# Patient Record
Sex: Male | Born: 1955 | Race: White | Hispanic: No | Marital: Married | Smoking: Former smoker
Health system: Southern US, Community
[De-identification: ages and names within clinical notes are randomized; demographics above are authoritative.]

## PROBLEM LIST (undated history)

## (undated) DIAGNOSIS — N400 Enlarged prostate without lower urinary tract symptoms: Secondary | ICD-10-CM

## (undated) DIAGNOSIS — E119 Type 2 diabetes mellitus without complications: Secondary | ICD-10-CM

## (undated) DIAGNOSIS — G4733 Obstructive sleep apnea (adult) (pediatric): Secondary | ICD-10-CM

## (undated) DIAGNOSIS — E785 Hyperlipidemia, unspecified: Secondary | ICD-10-CM

## (undated) HISTORY — PX: HERNIA REPAIR: SHX51

## (undated) HISTORY — PX: OTHER SURGICAL HISTORY: SHX169

---

## 2004-12-18 ENCOUNTER — Encounter: Admission: RE | Admit: 2004-12-18 | Discharge: 2004-12-18 | Payer: Self-pay | Admitting: General Surgery

## 2004-12-25 ENCOUNTER — Ambulatory Visit (HOSPITAL_BASED_OUTPATIENT_CLINIC_OR_DEPARTMENT_OTHER): Admission: RE | Admit: 2004-12-25 | Discharge: 2004-12-25 | Payer: Self-pay | Admitting: General Surgery

## 2004-12-25 ENCOUNTER — Ambulatory Visit (HOSPITAL_COMMUNITY): Admission: RE | Admit: 2004-12-25 | Discharge: 2004-12-25 | Payer: Self-pay | Admitting: General Surgery

## 2021-02-21 ENCOUNTER — Other Ambulatory Visit: Payer: Self-pay

## 2021-02-21 ENCOUNTER — Emergency Department (HOSPITAL_COMMUNITY): Payer: Commercial Managed Care - HMO

## 2021-02-21 ENCOUNTER — Encounter (HOSPITAL_COMMUNITY): Payer: Self-pay | Admitting: Student

## 2021-02-21 ENCOUNTER — Emergency Department (HOSPITAL_COMMUNITY)
Admission: EM | Admit: 2021-02-21 | Discharge: 2021-02-21 | Disposition: A | Discharge status: Home / Self Care | Payer: Commercial Managed Care - HMO | Attending: Emergency Medicine | Admitting: Emergency Medicine

## 2021-02-21 DIAGNOSIS — R002 Palpitations: Secondary | ICD-10-CM | POA: Diagnosis present

## 2021-02-21 DIAGNOSIS — Z7901 Long term (current) use of anticoagulants: Secondary | ICD-10-CM | POA: Diagnosis not present

## 2021-02-21 DIAGNOSIS — Z20822 Contact with and (suspected) exposure to covid-19: Secondary | ICD-10-CM | POA: Diagnosis not present

## 2021-02-21 DIAGNOSIS — I4891 Unspecified atrial fibrillation: Secondary | ICD-10-CM | POA: Diagnosis not present

## 2021-02-21 DIAGNOSIS — E119 Type 2 diabetes mellitus without complications: Secondary | ICD-10-CM | POA: Insufficient documentation

## 2021-02-21 DIAGNOSIS — F17290 Nicotine dependence, other tobacco product, uncomplicated: Secondary | ICD-10-CM | POA: Diagnosis not present

## 2021-02-21 HISTORY — DX: Benign prostatic hyperplasia without lower urinary tract symptoms: N40.0

## 2021-02-21 HISTORY — DX: Obstructive sleep apnea (adult) (pediatric): G47.33

## 2021-02-21 HISTORY — DX: Hyperlipidemia, unspecified: E78.5

## 2021-02-21 HISTORY — DX: Type 2 diabetes mellitus without complications: E11.9

## 2021-02-21 LAB — CBC WITH DIFFERENTIAL/PLATELET
Abs Immature Granulocytes: 0.01 10*3/uL (ref 0.00–0.07)
Basophils Absolute: 0 10*3/uL (ref 0.0–0.1)
Basophils Relative: 0 %
Eosinophils Absolute: 0.1 10*3/uL (ref 0.0–0.5)
Eosinophils Relative: 1 %
HCT: 38.5 % — ABNORMAL LOW (ref 39.0–52.0)
Hemoglobin: 12.8 g/dL — ABNORMAL LOW (ref 13.0–17.0)
Immature Granulocytes: 0 %
Lymphocytes Relative: 20 %
Lymphs Abs: 1.3 10*3/uL (ref 0.7–4.0)
MCH: 27.6 pg (ref 26.0–34.0)
MCHC: 33.2 g/dL (ref 30.0–36.0)
MCV: 83 fL (ref 80.0–100.0)
Monocytes Absolute: 0.6 10*3/uL (ref 0.1–1.0)
Monocytes Relative: 10 %
Neutro Abs: 4.2 10*3/uL (ref 1.7–7.7)
Neutrophils Relative %: 69 %
Platelets: 298 10*3/uL (ref 150–400)
RBC: 4.64 MIL/uL (ref 4.22–5.81)
RDW: 15.3 % (ref 11.5–15.5)
WBC: 6.2 10*3/uL (ref 4.0–10.5)
nRBC: 0 % (ref 0.0–0.2)

## 2021-02-21 LAB — RESP PANEL BY RT-PCR (FLU A&B, COVID) ARPGX2
Influenza A by PCR: NEGATIVE
Influenza B by PCR: NEGATIVE
SARS Coronavirus 2 by RT PCR: NEGATIVE

## 2021-02-21 LAB — COMPREHENSIVE METABOLIC PANEL
ALT: 37 U/L (ref 0–44)
AST: 25 U/L (ref 15–41)
Albumin: 3.8 g/dL (ref 3.5–5.0)
Alkaline Phosphatase: 49 U/L (ref 38–126)
Anion gap: 9 (ref 5–15)
BUN: 19 mg/dL (ref 8–23)
CO2: 24 mmol/L (ref 22–32)
Calcium: 9 mg/dL (ref 8.9–10.3)
Chloride: 102 mmol/L (ref 98–111)
Creatinine, Ser: 0.92 mg/dL (ref 0.61–1.24)
GFR, Estimated: >60 mL/min (ref >60)
Glucose, Bld: 226 mg/dL — ABNORMAL HIGH (ref 70–99)
Potassium: 4.1 mmol/L (ref 3.5–5.1)
Sodium: 135 mmol/L (ref 135–145)
Total Bilirubin: 0.8 mg/dL (ref 0.3–1.2)
Total Protein: 6.5 g/dL (ref 6.5–8.1)

## 2021-02-21 LAB — TSH: TSH: 1.486 u[IU]/mL (ref 0.350–4.500)

## 2021-02-21 LAB — TROPONIN I (HIGH SENSITIVITY)
Troponin I (High Sensitivity): 7 ng/L (ref <18)
Troponin I (High Sensitivity): 13 ng/L (ref <18)

## 2021-02-21 LAB — T4, FREE: Free T4: 1.24 ng/dL — ABNORMAL HIGH (ref 0.61–1.12)

## 2021-02-21 MED ORDER — APIXABAN (ELIQUIS) VTE STARTER PACK (10MG AND 5MG): ORAL_TABLET | ORAL | 0 refills | Status: DC

## 2021-02-21 MED ORDER — SODIUM CHLORIDE 0.9 % IV BOLUS
1000.0000 mL | Freq: Once | INTRAVENOUS | Status: AC
  Administered 2021-02-21: 1000 mL via INTRAVENOUS

## 2021-02-21 MED ORDER — APIXABAN 5 MG PO TABS
5.0000 mg | ORAL_TABLET | Freq: Two times a day (BID) | ORAL | Status: DC
  Administered 2021-02-21: 5 mg via ORAL
  Filled 2021-02-21: qty 1

## 2021-02-21 MED ORDER — ETOMIDATE 2 MG/ML IV SOLN
10.0000 mg | Freq: Once | INTRAVENOUS | Status: DC
  Filled 2021-02-21: qty 10

## 2021-02-21 MED ORDER — SODIUM CHLORIDE 0.9 % IV BOLUS
500.0000 mL | Freq: Once | INTRAVENOUS | Status: AC
  Administered 2021-02-21: 500 mL via INTRAVENOUS

## 2021-02-21 MED ORDER — APIXABAN 5 MG PO TABS: 5.0000 mg | ORAL_TABLET | Freq: Two times a day (BID) | ORAL | 0 refills | Status: DC

## 2021-02-21 MED ORDER — DILTIAZEM LOAD VIA INFUSION
20.0000 mg | Freq: Once | INTRAVENOUS | Status: AC
  Administered 2021-02-21: 20 mg via INTRAVENOUS
  Filled 2021-02-21: qty 20

## 2021-02-21 MED ORDER — APIXABAN 2.5 MG PO TABS: ORAL_TABLET | ORAL | 0 refills | Status: DC

## 2021-02-21 MED ORDER — ETOMIDATE 2 MG/ML IV SOLN
INTRAVENOUS | Status: AC | PRN
  Administered 2021-02-21: 9 mg via INTRAVENOUS

## 2021-02-21 MED ORDER — DILTIAZEM HCL-DEXTROSE 125-5 MG/125ML-% IV SOLN (PREMIX)
5.0000 mg/h | INTRAVENOUS | Status: DC
  Administered 2021-02-21: 5 mg/h via INTRAVENOUS
  Filled 2021-02-21: qty 125

## 2021-02-21 NOTE — Discharge Instructions (Addendum)
Blake Oconnor, it was a pleasure taking care of you. You were seen for Atrial Fibrillation with Rapid Ventricular Response. Your rate was controlled with diltiazem, but you remained in atrial fibrillation so you were electrically cardioverted. Your heart then went into normal rhythm. Throughout this time, your vital signs were stable.  Your workup was also reassuring. You had a negative COVID test, normal troponin (heart enzyme), normal electrolytes. Your hemoglobin was 12.8 which is slightly anemic. Your thyroid testing was mildly abnormal with TSH normal at 1.48 and T4 slightly high at 1.24. Follow up with your PCP on this.  We are discharging you with an anticoagulant, Eliquis. Take twice a day. I have placed a referral for the A fib clinic, please follow up with them. If you have recurrent A fib, please return for further care.     Information on my medicine - ELIQUIS (apixaban)  This medication education was reviewed with me or my healthcare representative as part of my discharge preparation.    Why was Eliquis prescribed for you? Eliquis was prescribed for you to reduce the risk of a blood clot forming that can cause a stroke if you have a medical condition called atrial fibrillation (a type of irregular heartbeat).  What do You need to know about Eliquis ? Take your Eliquis TWICE DAILY - one tablet in the morning and one tablet in the evening with or without food. If you have difficulty swallowing the tablet whole please discuss with your pharmacist how to take the medication safely.  Take Eliquis exactly as prescribed by your doctor and DO NOT stop taking Eliquis without talking to the doctor who prescribed the medication.  Stopping may increase your risk of developing a stroke.  Refill your prescription before you run out.  After discharge, you should have regular check-up appointments with your healthcare provider that is prescribing your Eliquis.  In the future your dose may need  to be changed if your kidney function or weight changes by a significant amount or as you get older.  What do you do if you miss a dose? If you miss a dose, take it as soon as you remember on the same day and resume taking twice daily.  Do not take more than one dose of ELIQUIS at the same time to make up a missed dose.  Important Safety Information A possible side effect of Eliquis is bleeding. You should call your healthcare provider right away if you experience any of the following: Bleeding from an injury or your nose that does not stop. Unusual colored urine (red or dark brown) or unusual colored stools (red or black). Unusual bruising for unknown reasons. A serious fall or if you hit your head (even if there is no bleeding).  Some medicines may interact with Eliquis and might increase your risk of bleeding or clotting while on Eliquis. To help avoid this, consult your healthcare provider or pharmacist prior to using any new prescription or non-prescription medications, including herbals, vitamins, non-steroidal anti-inflammatory drugs (NSAIDs) and supplements.  This website has more information on Eliquis (apixaban): http://www.eliquis.com/eliquis/home

## 2021-02-21 NOTE — ED Triage Notes (Signed)
Pt BIB [facility] from home c/o A-fib RVR. Pt stated he was walking his dog when he felt palpitations and weak, apple watch said he was in A-fib 170's. Pt received 10 of cardizem no change, another 10 and converted to A-fib rate of 70-110.

## 2021-02-21 NOTE — ED Provider Notes (Addendum)
Physical Exam  BP 122/78   Pulse (!) 57   Temp 98 F (36.7 C) (Oral)   Resp 12   Ht 1.778 m (5\' 10" )   Wt 79.4 kg   SpO2 100%   BMI 25.11 kg/m   Physical Exam  ED Course/Procedures    I saw and evaluated the patient, reviewed the resident's note and I agree with the findings and plan.  EKG Interpretation  Date/Time:  Friday February 21 2021 09:00:07 EDT Ventricular Rate:  115 PR Interval:    QRS Duration: 99 QT Interval:  310 QTC Calculation: 429 R Axis:   68 Text Interpretation: Atrial fibrillation Borderline repolarization abnormality Confirmed by 12[birth year]14[birth year]1983 (Lorre Nick) on 02/21/2021 9:07:29 AM   .Cardioversion  Date/Time: 02/21/2021 1:39 PM Performed by: 02/23/2021, MD Authorized by: Lorre Nick, MD   Consent:    Consent obtained:  Written   Consent given by:  Patient   Risks discussed:  Death   Alternatives discussed:  No treatment Pre-procedure details:    Cardioversion basis:  Emergent   Rhythm:  Atrial fibrillation   Electrode placement:  Anterior-posterior Patient sedated: Yes. Refer to sedation procedure documentation for details of sedation.  Attempt one:    Cardioversion mode:  Synchronous   Waveform:  Monophasic   Shock (Joules):  200   Shock outcome:  Conversion to normal sinus rhythm Post-procedure details:    Patient status:  Awake   Patient tolerance of procedure:  Tolerated well, no immediate complications .Sedation  Date/Time: 02/21/2021 1:40 PM Performed by: 02/23/2021, MD Authorized by: Lorre Nick, MD   Consent:    Consent obtained:  Written   Consent given by:  Patient   Risks discussed:  Dysrhythmia   Alternatives discussed:  Analgesia without sedation Universal protocol:    Immediately prior to procedure, a time out was called: no     Patient identity confirmed:  Arm band Indications:    Procedure performed:  Cardioversion Pre-sedation assessment:    Time since last food or drink:  7pm   ASA classification:  class 1 - normal, healthy patient     Mouth opening:  3 or more finger widths   Mallampati score:  I - soft palate, uvula, fauces, pillars visible   Neck mobility: normal     Pre-sedation assessments completed and reviewed: airway patency     Pre-sedation assessment completed:  02/21/2021 1:21 PM Immediate pre-procedure details:    Reassessment: Patient reassessed immediately prior to procedure     Reviewed: vital signs     Verified: bag valve mask available, emergency equipment available, intubation equipment available, IV patency confirmed, oxygen available and suction available   Procedure details (see MAR for exact dosages):    Preoxygenation:  Room air   Sedation:  Etomidate   Intended level of sedation: deep   Intra-procedure monitoring:  Blood pressure monitoring, continuous capnometry, continuous pulse oximetry and cardiac monitor   Intra-procedure events: none     Total Provider sedation time (minutes):  15 Post-procedure details:    Post-sedation assessment completed:  02/21/2021 1:42 PM   Attendance: Constant attendance by certified staff until patient recovered     Recovery: Patient returned to pre-procedure baseline     Post-sedation assessments completed and reviewed: airway patency, cardiovascular function and hydration status     Patient is stable for discharge or admission: yes     Procedure completion:  Tolerated well, no immediate complications    Patient is in sinus rhythm at this time.  Will start anticoagulant and give referral to A. fib clinic.  CRITICAL CARE Performed by: Toy Baker Total critical care time: 55 minutes Critical care time was exclusive of separately billable procedures and treating other patients. Critical care was necessary to treat or prevent imminent or life-threatening deterioration. Critical care was time spent personally by me on the following activities: development of treatment plan with patient and/or surrogate as well as nursing,  discussions with consultants, evaluation of patient's response to treatment, examination of patient, obtaining history from patient or surrogate, ordering and performing treatments and interventions, ordering and review of laboratory studies, ordering and review of radiographic studies, pulse oximetry and re-evaluation of patient's condition.    Lorre Nick, MD 02/21/21 1344    Lorre Nick, MD 03/06/21 [phone]

## 2021-02-21 NOTE — ED Provider Notes (Signed)
[facility] EMERGENCY DEPARTMENT Provider Note   CSN: [id number] Arrival date & time: 02/21/21  6295     History Chief Complaint  Patient presents with  . A-fib RVR    Blake Oconnor is a 65 y.o. male DM, OSA, HLD who presents with palpitations, found to be in A fib with RVR by EMS. States that after waking this morning, he started having palpitations. His Apple Watch read his heart rate at 170 and in A fib. He has had occasionally palpitations before which have resolved spontaneously, so he went about his normal routine. 45 minutes later he was walking his dog when he felt dizzy and weak, so he called EMS. EMS administered 20mg  of cardizem with improvement in heart rate to 110s-130s. He denies chest pain or shortness of breath. Has been in his usual state of health until this morning. He did have a plane ride last week but it was only 1 hour 45 minutes. No leg swelling. He drinks a glass of wine roughly 3 nights a week. Has had Covid vaccine x2 and a booster. No history of known heart disease or lung disease.   Past Medical History:  Diagnosis Date  . BPH (benign prostatic hyperplasia)   . Diabetes mellitus without complication (HCC)   . HLD (hyperlipidemia)   . OSA (obstructive sleep apnea)     Past Surgical History:  Procedure Laterality Date  . HERNIA REPAIR    . hydrocelectomy    . vericocelectomy         Family History  Problem Relation Age of Onset  . Diabetes Father   . Diabetes Brother   . Diabetes Brother   . Breast cancer Mother   . Cervical cancer Mother   . Stroke Paternal Uncle   . Heart attack Neg Hx   . Lung cancer Neg Hx     Social History   Tobacco Use  . Smoking status: Current Every Day Smoker  . Smokeless tobacco: Never Used  . Tobacco comment: used to smoke cigars  Substance Use Topics  . Alcohol use: Yes    Alcohol/week: 3.0 standard drinks    Types: 3 Standard drinks or equivalent per week    Comment: 1 glass of wine at  times  . Drug use: Never    Home Medications Prior to Admission medications   Medication Sig Start Date End Date Taking? Authorizing Provider  apixaban (ELIQUIS) 5 MG TABS tablet Take 1 tablet (5 mg total) by mouth 2 (two) times daily. 02/21/21 03/23/21  03/25/21, MD    Allergies    Patient has no known allergies.  Review of Systems   Review of Systems  Constitutional: Negative for chills, fever and unexpected weight change.  HENT: Negative for congestion and sore throat.   Eyes: Negative for visual disturbance.  Respiratory: Negative for cough and shortness of breath.   Cardiovascular: Positive for palpitations. Negative for chest pain and leg swelling.  Gastrointestinal: Negative for abdominal pain, nausea and vomiting.  Musculoskeletal: Negative for arthralgias and myalgias.  Neurological: Positive for dizziness and weakness.  Psychiatric/Behavioral: Negative for agitation and confusion.  All other systems reviewed and are negative.   Physical Exam Updated Vital Signs BP 113/74   Pulse 63   Temp 98.1 F (36.7 C) (Oral)   Resp 19   Ht 5\' 10"  (1.778 m)   Wt 79.4 kg   SpO2 99%   BMI 25.11 kg/m   Physical Exam Vitals and nursing note  reviewed.  Constitutional:      General: He is not in acute distress.    Appearance: Normal appearance. He is not ill-appearing or toxic-appearing.  HENT:     Head: Normocephalic and atraumatic.     Right Ear: External ear normal.     Left Ear: External ear normal.     Nose: Nose normal. No congestion.     Mouth/Throat:     Mouth: Mucous membranes are moist.     Pharynx: Oropharynx is clear.  Eyes:     Extraocular Movements: Extraocular movements intact.  Cardiovascular:     Rate and Rhythm: Tachycardia present. Rhythm irregular.     Heart sounds: No murmur heard. No friction rub. No gallop.   Pulmonary:     Effort: Pulmonary effort is normal. No respiratory distress.     Breath sounds: Normal breath sounds. No wheezing,  rhonchi or rales.  Abdominal:     General: Abdomen is flat.     Palpations: Abdomen is soft.     Tenderness: There is no abdominal tenderness. There is no guarding.  Musculoskeletal:        General: No deformity or signs of injury. Normal range of motion.     Cervical back: Normal range of motion and neck supple.     Right lower leg: No edema.     Left lower leg: No edema.  Skin:    General: Skin is warm and dry.     Capillary Refill: Capillary refill takes less than 2 seconds.  Neurological:     General: No focal deficit present.     Mental Status: He is alert and oriented to person, place, and time.  Psychiatric:        Mood and Affect: Mood normal.        Behavior: Behavior normal.     ED Results / Procedures / Treatments   Labs (all labs ordered are listed, but only abnormal results are displayed) Labs Reviewed  COMPREHENSIVE METABOLIC PANEL - Abnormal; Notable for the following components:      Result Value   Glucose, Bld 226 (*)    All other components within normal limits  CBC WITH DIFFERENTIAL/PLATELET - Abnormal; Notable for the following components:   Hemoglobin 12.8 (*)    HCT 38.5 (*)    All other components within normal limits  T4, FREE - Abnormal; Notable for the following components:   Free T4 1.24 (*)    All other components within normal limits  RESP PANEL BY RT-PCR (FLU A&B, COVID) ARPGX2  TSH  TROPONIN I (HIGH SENSITIVITY)  TROPONIN I (HIGH SENSITIVITY)    EKG EKG Interpretation  Date/Time:  Friday February 21 2021 13:31:55 EDT Ventricular Rate:  70 PR Interval:    QRS Duration: 86 QT Interval:  372 QTC Calculation: 402 R Axis:   58 Text Interpretation: Sinus rhythm afib has resolved Confirmed by Lorre Nick ([id number]) on 02/21/2021 2:51:02 PM   Radiology DG Chest Portable 1 View  Result Date: 02/21/2021 CLINICAL DATA:  Atrial fibrillation. EXAM: PORTABLE CHEST 1 VIEW COMPARISON:  December 18, 2004. FINDINGS: The heart size and mediastinal  contours are within normal limits. Both lungs are clear. No pneumothorax or pleural effusion is noted. The visualized skeletal structures are unremarkable. IMPRESSION: No active disease. Electronically Signed   By: Lupita Raider M.D.   On: 02/21/2021 09:29    Procedures Procedures   Medications Ordered in ED Medications  diltiazem (CARDIZEM) 1 mg/mL load via infusion 20 mg (  20 mg Intravenous Bolus from Bag 02/21/21 1058)    And  diltiazem (CARDIZEM) 125 mg in dextrose 5% 125 mL (1 mg/mL) infusion (0 mg/hr Intravenous Stopped 02/21/21 1338)  etomidate (AMIDATE) injection 10 mg (has no administration in time range)  apixaban (ELIQUIS) tablet 5 mg (5 mg Oral Given 02/21/21 1449)  sodium chloride 0.9 % bolus 500 mL (0 mLs Intravenous Stopped 02/21/21 1131)  sodium chloride 0.9 % bolus 1,000 mL (0 mLs Intravenous Stopped 02/21/21 1445)  etomidate (AMIDATE) injection (9 mg Intravenous Given 02/21/21 1329)    ED Course  I have reviewed the triage vital signs and the nursing notes.  Pertinent labs & imaging results that were available during my care of the patient were reviewed by me and considered in my medical decision making (see chart for details).    MDM Rules/Calculators/A&P     CHA2DS2-VASc Score: 1                     Patient presents with first known episode of A fib which started this morning. Hemodynamically stable. Received 20mg  cardizem prior to arrival with improvement. No predisposing history including heart disease, lung disease, mild alcohol use, thyroid disease. Workup pending, start cardizem bolus and drip.   Update: since starting diltiazem drip, rate is now controlled in 70s but remains in A fib. Palpitations have resolved. Giving 500cc bolus for soft BP in 100s/70s.  Update: remains in rate controlled A fib. Proceeded with cardioversion as he remained in A fib despite diltiazem treatment. He tolerated the procedure well and converted into sinus rhythm. Vitals remain stable  throughout. CHADSVASC score of 1, Started on Eliquis for anticoagulation. Discharged with referral to A fib clinic placed. Normal TSH with T4 mildly elevated, patient will follow up with PCP for this.  Final Clinical Impression(s) / ED Diagnoses Final diagnoses:  Atrial fibrillation with RVR (HCC)    Rx / DC Orders ED Discharge Orders         Ordered    Amb referral to AFIB Clinic        02/21/21 1336    APIXABAN (ELIQUIS) VTE STARTER PACK (10MG  AND 5MG )  Status:  Discontinued        02/21/21 1402    apixaban (ELIQUIS) 2.5 MG TABS tablet  Status:  Discontinued        02/21/21 1414    apixaban (ELIQUIS) 5 MG TABS tablet  2 times daily        02/21/21 1424           02/23/21, MD 02/21/21 1506    02/23/21, MD 02/24/21 [phone]

## 2021-03-03 NOTE — Progress Notes (Signed)
Primary Care Physician: Darrow Bussing, MD Primary Cardiologist: none Primary Electrophysiologist: none Referring Physician: [facility]   Blake Oconnor is a 65 y.o. male with a history of DM, OSA, HLD, and atrial fibrillation who presents for consultation in the [facility]. The patient was initially diagnosed with atrial fibrillation 02/21/21 after presenting to the ED with symptoms of palpitations. His Apple Watch read afib with RVR. He started having associated symptoms of dizziness and weakness and so EMS was called. ECG at the ED showed rate controlled afib. He underwent DCCV at that time and was started on Eliquis a CHADS2VASC score of 1. There were no specific triggers that he could identify. He is compliant with his CPAP and drinks 2-3 glasses of wine per week. He has not had any further symptoms.   Today, he denies symptoms of palpitations, chest pain, shortness of breath, orthopnea, PND, lower extremity edema, dizziness, presyncope, syncope, bleeding, or neurologic sequela. The patient is tolerating medications without difficulties and is otherwise without complaint today.    Atrial Fibrillation Risk Factors:  he does have symptoms or diagnosis of sleep apnea. he is compliant with CPAP therapy. he does not have a history of rheumatic fever. he does have a history of alcohol use. The patient does not have a history of early familial atrial fibrillation or other arrhythmias.  he has a BMI of Body mass index is 25.31 kg/m.Marland Kitchen Filed Weights   03/04/21 0858  Weight: 80 kg    Family History  Problem Relation Age of Onset  . Diabetes Father   . Diabetes Brother   . Diabetes Brother   . Breast cancer Mother   . Cervical cancer Mother   . Stroke Paternal Uncle   . Heart attack Neg Hx   . Lung cancer Neg Hx      Atrial Fibrillation Management history:  Previous antiarrhythmic drugs: none Previous cardioversions: 02/21/21 Previous ablations:  none CHADS2VASC score: 1 Anticoagulation history: Eliquis   Past Medical History:  Diagnosis Date  . BPH (benign prostatic hyperplasia)   . Diabetes mellitus without complication (HCC)   . HLD (hyperlipidemia)   . OSA (obstructive sleep apnea)    Past Surgical History:  Procedure Laterality Date  . HERNIA REPAIR    . hydrocelectomy    . vericocelectomy      Current Outpatient Medications  Medication Sig Dispense Refill  . apixaban (ELIQUIS) 5 MG TABS tablet Take 1 tablet (5 mg total) by mouth 2 (two) times daily. 60 tablet 0  . cetirizine (ZYRTEC) 10 MG tablet     . glipiZIDE (GLUCOTROL XL) 2.5 MG 24 hr tablet Take 2.5 mg by mouth daily.    Marland Kitchen glucose blood (ONETOUCH VERIO) test strip See admin instructions.    . metFORMIN (GLUCOPHAGE) 1000 MG tablet Take 1 tablet by mouth 2 (two) times daily.    . naproxen (NAPROSYN) 500 MG tablet     . OneTouch Delica Lancets 33G MISC See admin instructions.    . Pediatric Multiple Vit-C-FA (FLINSTONES GUMMIES OMEGA-3 DHA) CHEW Chew by mouth.    . simvastatin (ZOCOR) 20 MG tablet Take 20 mg by mouth daily.    . tamsulosin (FLOMAX) 0.4 MG CAPS capsule Take 0.4 mg by mouth daily.     No current facility-administered medications for this encounter.    No Known Allergies  Social History   Socioeconomic History  . Marital status: Unknown    Spouse name: Not on file  .  Number of children: Not on file  . Years of education: Not on file  . Highest education level: Not on file  Occupational History  . Not on file  Tobacco Use  . Smoking status: Former Games developer  . Smokeless tobacco: Never Used  . Tobacco comment: used to smoke cigars  Substance and Sexual Activity  . Alcohol use: Not Currently    Alcohol/week: 3.0 standard drinks    Types: 3 Glasses of wine per week    Comment: 1 glass of wine at times  . Drug use: Never  . Sexual activity: Not on file  Other Topics Concern  . Not on file  Social History Narrative  . Not on file    Social Determinants of Health   Financial Resource Strain: Not on file  Food Insecurity: Not on file  Transportation Needs: Not on file  Physical Activity: Not on file  Stress: Not on file  Social Connections: Not on file  Intimate Partner Violence: Not on file     ROS- All systems are reviewed and negative except as per the HPI above.  Physical Exam: Vitals:   03/04/21 0858  BP: 112/72  Pulse: 69  Weight: 80 kg  Height: 5\' 10"  (1.778 m)    GEN- The patient is a well appearing male, alert and oriented x 3 today.   Head- normocephalic, atraumatic Eyes-  Sclera clear, conjunctiva pink Ears- hearing intact Oropharynx- clear Neck- supple  Lungs- Clear to ausculation bilaterally, normal work of breathing Heart- Regular rate and rhythm, no murmurs, rubs or gallops  GI- soft, NT, ND, + BS Extremities- no clubbing, cyanosis, or edema MS- no significant deformity or atrophy Skin- no rash or lesion Psych- euthymic mood, full affect Neuro- strength and sensation are intact  Wt Readings from Last 3 Encounters:  03/04/21 80 kg  02/21/21 79.4 kg    EKG today demonstrates  SR Vent. rate 69 BPM PR interval 126 ms QRS duration 102 ms QT/QTcB 370/396 ms  Epic records are reviewed at length today  CHA2DS2-VASc Score = 1  The patient's score is based upon: CHF History: No HTN History: No Diabetes History: Yes Stroke History: No Vascular Disease History: No Age Score: 0 Gender Score: 0      ASSESSMENT AND PLAN: 1. Paroxysmal Atrial Fibrillation (ICD10:  I48.0) The patient's CHA2DS2-VASc score is 1, indicating a 0.6% annual risk of stroke.   S/p DCCV on 02/21/21 General education about afib provided and questions answered. We also discussed his stroke risk and the risks and benefits of anticoagulation. Check echocardiogram Continue Eliquis 5 mg BID for at least 4 weeks post DCCV. Start diltiazem 30 mg PRN q 4 hours for heart racing.  Apple Watch for home  monitoring   2. Obstructive sleep apnea The importance of adequate treatment of sleep apnea was discussed today in order to improve our ability to maintain sinus rhythm long term. Patient reports compliance with his CPAP therapy.    Follow up in the AF clinic in one month.    02/23/21 PA-C Afib Clinic [facility] [address] [place], [place] [phone] 03/04/2021 9:07 AM

## 2021-03-04 ENCOUNTER — Encounter (HOSPITAL_COMMUNITY): Payer: Self-pay | Admitting: Physician Assistant

## 2021-03-04 ENCOUNTER — Other Ambulatory Visit: Payer: Self-pay

## 2021-03-04 ENCOUNTER — Ambulatory Visit (HOSPITAL_COMMUNITY)
Admission: RE | Admit: 2021-03-04 | Discharge: 2021-03-04 | Disposition: A | Discharge status: Home / Self Care | Payer: Commercial Managed Care - HMO | Source: Ambulatory Visit | Attending: Physician Assistant | Admitting: Physician Assistant

## 2021-03-04 VITALS — BP 112/72 | HR 69 | Ht 70.0 in | Wt 176.4 lb

## 2021-03-04 DIAGNOSIS — E785 Hyperlipidemia, unspecified: Secondary | ICD-10-CM | POA: Insufficient documentation

## 2021-03-04 DIAGNOSIS — E119 Type 2 diabetes mellitus without complications: Secondary | ICD-10-CM | POA: Diagnosis not present

## 2021-03-04 DIAGNOSIS — Z7984 Long term (current) use of oral hypoglycemic drugs: Secondary | ICD-10-CM | POA: Insufficient documentation

## 2021-03-04 DIAGNOSIS — G4733 Obstructive sleep apnea (adult) (pediatric): Secondary | ICD-10-CM | POA: Diagnosis not present

## 2021-03-04 DIAGNOSIS — Z87891 Personal history of nicotine dependence: Secondary | ICD-10-CM | POA: Diagnosis not present

## 2021-03-04 DIAGNOSIS — I480 Paroxysmal atrial fibrillation: Secondary | ICD-10-CM

## 2021-03-04 DIAGNOSIS — Z79899 Other long term (current) drug therapy: Secondary | ICD-10-CM | POA: Diagnosis not present

## 2021-03-04 MED ORDER — DILTIAZEM HCL 30 MG PO TABS: ORAL_TABLET | ORAL | 1 refills | Status: DC

## 2021-03-04 NOTE — Patient Instructions (Signed)
Cardizem 30mg -- take 1 tablet every 4 hours AS NEEDED for heart rate >100 as long as top number of blood pressure >100.  

## 2021-04-07 ENCOUNTER — Ambulatory Visit (HOSPITAL_BASED_OUTPATIENT_CLINIC_OR_DEPARTMENT_OTHER)
Admission: RE | Admit: 2021-04-07 | Discharge: 2021-04-07 | Disposition: A | Discharge status: Home / Self Care | Payer: PRIVATE HEALTH INSURANCE | Source: Ambulatory Visit | Attending: Physician Assistant | Admitting: Physician Assistant

## 2021-04-07 ENCOUNTER — Other Ambulatory Visit: Payer: Self-pay

## 2021-04-07 ENCOUNTER — Encounter (HOSPITAL_COMMUNITY): Payer: Self-pay | Admitting: *Deleted

## 2021-04-07 ENCOUNTER — Encounter (HOSPITAL_COMMUNITY): Payer: Self-pay | Admitting: Physician Assistant

## 2021-04-07 ENCOUNTER — Ambulatory Visit (HOSPITAL_COMMUNITY)
Admission: RE | Admit: 2021-04-07 | Discharge: 2021-04-07 | Disposition: A | Discharge status: Home / Self Care | Payer: PRIVATE HEALTH INSURANCE | Source: Ambulatory Visit | Attending: Family Medicine | Admitting: Family Medicine

## 2021-04-07 VITALS — BP 112/68 | HR 63 | Ht 70.0 in | Wt 178.6 lb

## 2021-04-07 DIAGNOSIS — G4733 Obstructive sleep apnea (adult) (pediatric): Secondary | ICD-10-CM | POA: Insufficient documentation

## 2021-04-07 DIAGNOSIS — E785 Hyperlipidemia, unspecified: Secondary | ICD-10-CM | POA: Diagnosis not present

## 2021-04-07 DIAGNOSIS — Z7984 Long term (current) use of oral hypoglycemic drugs: Secondary | ICD-10-CM | POA: Insufficient documentation

## 2021-04-07 DIAGNOSIS — Z79899 Other long term (current) drug therapy: Secondary | ICD-10-CM | POA: Insufficient documentation

## 2021-04-07 DIAGNOSIS — E119 Type 2 diabetes mellitus without complications: Secondary | ICD-10-CM | POA: Insufficient documentation

## 2021-04-07 DIAGNOSIS — Z720 Tobacco use: Secondary | ICD-10-CM | POA: Diagnosis not present

## 2021-04-07 DIAGNOSIS — I480 Paroxysmal atrial fibrillation: Secondary | ICD-10-CM

## 2021-04-07 DIAGNOSIS — Z9989 Dependence on other enabling machines and devices: Secondary | ICD-10-CM | POA: Insufficient documentation

## 2021-04-07 LAB — ECHOCARDIOGRAM COMPLETE
Area-P 1/2: 1.7 cm2
Calc EF: 49.7 %
S' Lateral: 3.3 cm
Single Plane A2C EF: 50.6 %
Single Plane A4C EF: 50.4 %

## 2021-04-07 NOTE — Progress Notes (Signed)
  Echocardiogram 2D Echocardiogram has been performed.  Augustine Radar 04/07/2021, 10:52 AM

## 2021-04-07 NOTE — Progress Notes (Signed)
Primary Care Physician: Darrow Bussing, MD Primary Cardiologist: none Primary Electrophysiologist: none Referring Physician: [facility]   Blake Oconnor is a 65 y.o. male with a history of DM, OSA, HLD, and atrial fibrillation who presents for follow up in the [facility]. The patient was initially diagnosed with atrial fibrillation 02/21/21 after presenting to the ED with symptoms of palpitations. His Apple Watch read afib with RVR. He started having associated symptoms of dizziness and weakness and so EMS was called. ECG at the ED showed rate controlled afib. He underwent DCCV at that time and was started on Eliquis a CHADS2VASC score of 1. There were no specific triggers that he could identify. He is compliant with his CPAP and drinks 2-3 glasses of wine per week.   On follow up today, patient reports he has done well since his last visit. He had one episode of palpitations which only lasted 1-2 seconds. He had an echo today and the results are pending. He is now off anticoagulation with a low CV score.   Today, he denies symptoms of palpitations, chest pain, shortness of breath, orthopnea, PND, lower extremity edema, dizziness, presyncope, syncope, bleeding, or neurologic sequela. The patient is tolerating medications without difficulties and is otherwise without complaint today.    Atrial Fibrillation Risk Factors:  he does have symptoms or diagnosis of sleep apnea. he is compliant with CPAP therapy. he does not have a history of rheumatic fever. he does have a history of alcohol use. The patient does not have a history of early familial atrial fibrillation or other arrhythmias.  he has a BMI of Body mass index is 25.63 kg/m.Marland Kitchen Filed Weights   04/07/21 1058  Weight: 81 kg    Family History  Problem Relation Age of Onset  . Diabetes Father   . Diabetes Brother   . Diabetes Brother   . Breast cancer Mother   . Cervical cancer Mother   . Stroke  Paternal Uncle   . Heart attack Neg Hx   . Lung cancer Neg Hx      Atrial Fibrillation Management history:  Previous antiarrhythmic drugs: none Previous cardioversions: 02/21/21 Previous ablations: none CHADS2VASC score: 1 Anticoagulation history: Eliquis   Past Medical History:  Diagnosis Date  . BPH (benign prostatic hyperplasia)   . Diabetes mellitus without complication (HCC)   . HLD (hyperlipidemia)   . OSA (obstructive sleep apnea)    Past Surgical History:  Procedure Laterality Date  . HERNIA REPAIR    . hydrocelectomy    . vericocelectomy      Current Outpatient Medications  Medication Sig Dispense Refill  . acetaminophen (TYLENOL) 500 MG tablet Take 500 mg by mouth every 6 (six) hours as needed.    . cetirizine (ZYRTEC) 10 MG tablet     . diltiazem (CARDIZEM) 30 MG tablet Take 1 tablet every 4 hours AS NEEDED for heart rate >100 as long as blood pressure >100. 30 tablet 1  . glipiZIDE (GLUCOTROL XL) 2.5 MG 24 hr tablet Take 2.5 mg by mouth daily.    Marland Kitchen glucose blood (ONETOUCH VERIO) test strip See admin instructions.    . metFORMIN (GLUCOPHAGE) 1000 MG tablet Take 1 tablet by mouth 2 (two) times daily.    Letta Pate Delica Lancets 33G MISC See admin instructions.    . Pediatric Multiple Vit-C-FA (FLINSTONES GUMMIES OMEGA-3 DHA) CHEW Chew by mouth.    . simvastatin (ZOCOR) 20 MG tablet Take 20 mg by mouth  daily.    . tamsulosin (FLOMAX) 0.4 MG CAPS capsule Take 0.4 mg by mouth daily.     No current facility-administered medications for this encounter.    No Known Allergies  Social History   Socioeconomic History  . Marital status: Married    Spouse name: Not on file  . Number of children: Not on file  . Years of education: Not on file  . Highest education level: Not on file  Occupational History  . Not on file  Tobacco Use  . Smoking status: Light Tobacco Smoker    Types: Cigars  . Smokeless tobacco: Never Used  . Tobacco comment: cigars OCC   Substance and Sexual Activity  . Alcohol use: Not Currently    Alcohol/week: 3.0 standard drinks    Types: 3 Glasses of wine per week    Comment: 1 glass of wine at times  . Drug use: Never  . Sexual activity: Not on file  Other Topics Concern  . Not on file  Social History Narrative  . Not on file   Social Determinants of Health   Financial Resource Strain: Not on file  Food Insecurity: Not on file  Transportation Needs: Not on file  Physical Activity: Not on file  Stress: Not on file  Social Connections: Not on file  Intimate Partner Violence: Not on file     ROS- All systems are reviewed and negative except as per the HPI above.  Physical Exam: Vitals:   04/07/21 1058  BP: 112/68  Pulse: 63  Weight: 81 kg  Height: 5\' 10"  (1.778 m)    GEN- The patient is a well appearing male, alert and oriented x 3 today.  HEENT-head normocephalic, atraumatic, sclera clear, conjunctiva pink, hearing intact, trachea midline. Lungs- Clear to ausculation bilaterally, normal work of breathing Heart- Regular rate and rhythm, no murmurs, rubs or gallops  GI- soft, NT, ND, + BS Extremities- no clubbing, cyanosis, or edema MS- no significant deformity or atrophy Skin- no rash or lesion Psych- euthymic mood, full affect Neuro- strength and sensation are intact   Wt Readings from Last 3 Encounters:  04/07/21 81 kg  03/04/21 80 kg  02/21/21 79.4 kg    EKG today demonstrates  SR Vent. rate 63 BPM PR interval 142 ms QRS duration 94 ms QT/QTcB 380/388 ms  Epic records are reviewed at length today  CHA2DS2-VASc Score = 1  The patient's score is based upon: CHF History: No HTN History: No Diabetes History: Yes Stroke History: No Vascular Disease History: No Age Score: 0 Gender Score: 0      ASSESSMENT AND PLAN: 1. Paroxysmal Atrial Fibrillation (ICD10:  I48.0) The patient's CHA2DS2-VASc score is 1, indicating a 0.6% annual risk of stroke.   Patient appears to be  maintaining SR. Echocardiogram results pending. No anticoagulation indicated at this time.  Continue diltiazem 30 mg PRN q 4 hours for heart racing.  Apple Watch for home monitoring  If he should have recurrence of his afib, he is agreeable to front line ablation.   2. Obstructive sleep apnea Patient reports compliance with CPAP therapy.   Follow up in the AF clinic in 3 months.   02/23/21 PA-C Afib Clinic [facility] [address] [place], [place] [phone] 04/07/2021 11:02 AM

## 2021-07-09 ENCOUNTER — Encounter (HOSPITAL_COMMUNITY): Payer: Self-pay | Admitting: Physician Assistant

## 2021-07-09 ENCOUNTER — Ambulatory Visit (HOSPITAL_COMMUNITY)
Admission: RE | Admit: 2021-07-09 | Discharge: 2021-07-09 | Disposition: A | Discharge status: Home / Self Care | Payer: PRIVATE HEALTH INSURANCE | Source: Ambulatory Visit | Attending: Physician Assistant | Admitting: Physician Assistant

## 2021-07-09 ENCOUNTER — Other Ambulatory Visit: Payer: Self-pay

## 2021-07-09 VITALS — BP 114/72 | HR 64 | Ht 70.0 in | Wt 178.6 lb

## 2021-07-09 DIAGNOSIS — I480 Paroxysmal atrial fibrillation: Secondary | ICD-10-CM | POA: Insufficient documentation

## 2021-07-09 DIAGNOSIS — F17290 Nicotine dependence, other tobacco product, uncomplicated: Secondary | ICD-10-CM | POA: Insufficient documentation

## 2021-07-09 DIAGNOSIS — Z8249 Family history of ischemic heart disease and other diseases of the circulatory system: Secondary | ICD-10-CM | POA: Insufficient documentation

## 2021-07-09 DIAGNOSIS — G4733 Obstructive sleep apnea (adult) (pediatric): Secondary | ICD-10-CM | POA: Diagnosis not present

## 2021-07-09 DIAGNOSIS — Z79899 Other long term (current) drug therapy: Secondary | ICD-10-CM | POA: Insufficient documentation

## 2021-07-09 DIAGNOSIS — Z7984 Long term (current) use of oral hypoglycemic drugs: Secondary | ICD-10-CM | POA: Diagnosis not present

## 2021-07-09 NOTE — Progress Notes (Signed)
Primary Care Physician: Darrow Bussing, MD Primary Cardiologist: none Primary Electrophysiologist: none Referring Physician: [facility]   Blake Oconnor is a 65 y.o. male with a history of DM, OSA, HLD, and atrial fibrillation who presents for follow up in the [facility]. The patient was initially diagnosed with atrial fibrillation 02/21/21 after presenting to the ED with symptoms of palpitations. His Apple Watch read afib with RVR. He started having associated symptoms of dizziness and weakness and so EMS was called. ECG at the ED showed rate controlled afib. He underwent DCCV at that time and was started on Eliquis a CHADS2VASC score of 1. There were no specific triggers that he could identify. He is compliant with his CPAP and drinks 2-3 glasses of wine per week.   On follow up today, patient reports that he has done well since his last visit. He denies any sustained episodes of palpitations. He has not had to use his PRN diltiazem.   Today, he denies symptoms of palpitations, chest pain, shortness of breath, orthopnea, PND, lower extremity edema, dizziness, presyncope, syncope, bleeding, or neurologic sequela. The patient is tolerating medications without difficulties and is otherwise without complaint today.    Atrial Fibrillation Risk Factors:  he does have symptoms or diagnosis of sleep apnea. he is compliant with CPAP therapy. he does not have a history of rheumatic fever. he does have a history of alcohol use. The patient does not have a history of early familial atrial fibrillation or other arrhythmias.  he has a BMI of Body mass index is 25.63 kg/m.Marland Kitchen Filed Weights   07/09/21 0832  Weight: 81 kg     Family History  Problem Relation Age of Onset   Diabetes Father    Diabetes Brother    Diabetes Brother    Breast cancer Mother    Cervical cancer Mother    Stroke Paternal Uncle    Heart attack Neg Hx    Lung cancer Neg Hx       Atrial Fibrillation Management history:  Previous antiarrhythmic drugs: none Previous cardioversions: 02/21/21 Previous ablations: none CHADS2VASC score: 1 Anticoagulation history: Eliquis   Past Medical History:  Diagnosis Date   BPH (benign prostatic hyperplasia)    Diabetes mellitus without complication (HCC)    HLD (hyperlipidemia)    OSA (obstructive sleep apnea)    Past Surgical History:  Procedure Laterality Date   HERNIA REPAIR     hydrocelectomy     vericocelectomy      Current Outpatient Medications  Medication Sig Dispense Refill   acetaminophen (TYLENOL) 500 MG tablet Take 500 mg by mouth every 6 (six) hours as needed.     cetirizine (ZYRTEC) 10 MG tablet      diltiazem (CARDIZEM) 30 MG tablet Take 1 tablet every 4 hours AS NEEDED for heart rate >100 as long as blood pressure >100. 30 tablet 1   glipiZIDE (GLUCOTROL XL) 2.5 MG 24 hr tablet Take 2.5 mg by mouth daily.     glucose blood (ONETOUCH VERIO) test strip See admin instructions.     metFORMIN (GLUCOPHAGE) 1000 MG tablet Take 1 tablet by mouth 2 (two) times daily.     mometasone (ELOCON) 0.1 % ointment Apply topically.     OneTouch Delica Lancets 33G MISC See admin instructions.     Pediatric Multiple Vit-C-FA (FLINSTONES GUMMIES OMEGA-3 DHA) CHEW Chew by mouth.     simvastatin (ZOCOR) 20 MG tablet Take 20 mg by mouth daily.  tamsulosin (FLOMAX) 0.4 MG CAPS capsule Take 0.4 mg by mouth daily.     No current facility-administered medications for this encounter.    No Known Allergies  Social History   Socioeconomic History   Marital status: Married    Spouse name: Not on file   Number of children: Not on file   Years of education: Not on file   Highest education level: Not on file  Occupational History   Not on file  Tobacco Use   Smoking status: Light Smoker    Types: Cigars   Smokeless tobacco: Never   Tobacco comments:    cigars OCC  Substance and Sexual Activity   Alcohol use:  Yes    Alcohol/week: 3.0 standard drinks    Types: 3 Glasses of wine per week    Comment: 1 glass of wine at times   Drug use: Never   Sexual activity: Not on file  Other Topics Concern   Not on file  Social History Narrative   Not on file   Social Determinants of Health   Financial Resource Strain: Not on file  Food Insecurity: Not on file  Transportation Needs: Not on file  Physical Activity: Not on file  Stress: Not on file  Social Connections: Not on file  Intimate Partner Violence: Not on file     ROS- All systems are reviewed and negative except as per the HPI above.  Physical Exam: Vitals:   07/09/21 0832  BP: 114/72  Pulse: 64  Weight: 81 kg  Height: 5\' 10"  (1.778 m)    GEN- The patient is a well appearing male, alert and oriented x 3 today.   HEENT-head normocephalic, atraumatic, sclera clear, conjunctiva pink, hearing intact, trachea midline. Lungs- Clear to ausculation bilaterally, normal work of breathing Heart- Regular rate and rhythm, no murmurs, rubs or gallops  GI- soft, NT, ND, + BS Extremities- no clubbing, cyanosis, or edema MS- no significant deformity or atrophy Skin- no rash or lesion Psych- euthymic mood, full affect Neuro- strength and sensation are intact   Wt Readings from Last 3 Encounters:  07/09/21 81 kg  04/07/21 81 kg  03/04/21 80 kg    EKG today demonstrates  SR Vent. rate 64 BPM PR interval 134 ms QRS duration 88 ms QT/QTcB 378/389 ms  Echo 04/07/21  1. Left ventricular ejection fraction, by estimation, is 50 to 55%. The  left ventricle has low normal function. The left ventricle has no regional  wall motion abnormalities. Left ventricular diastolic parameters were  normal.   2. Right ventricular systolic function is normal. The right ventricular  size is normal. There is normal pulmonary artery systolic pressure.   3. The mitral valve is normal in structure. Trivial mitral valve  regurgitation. No evidence of mitral  stenosis.   4. The aortic valve is tricuspid. Aortic valve regurgitation is not  visualized. No aortic stenosis is present.   5. Aortic dilatation noted. There is mild dilatation of the aortic root,  measuring 38 mm. There is mild dilatation of the ascending aorta,  measuring 38 mm.   6. The inferior vena cava is normal in size with greater than 50%  respiratory variability, suggesting right atrial pressure of 3 mmHg.   Epic records are reviewed at length today  CHA2DS2-VASc Score = 1  The patient's score is based upon: CHF History: No HTN History: No Diabetes History: Yes Stroke History: No Vascular Disease History: No Age Score: 0 Gender Score: 0  ASSESSMENT AND PLAN: 1. Paroxysmal Atrial Fibrillation (ICD10:  I48.0) The patient's CHA2DS2-VASc score is 1, indicating a 0.6% annual risk of stroke.   Patient appears to be maintaining SR. No anticoagulation indicated at this time.  Continue diltiazem 30 mg PRN q 4 hours for heart racing.  Apple Watch for home monitoring  If he should have recurrence of his afib, he is agreeable to front line ablation.   2. Obstructive sleep apnea Patient reports compliance with CPAP therapy.   Follow up in the AF clinic in 1 one year.    Jorja Loa PA-C Afib Clinic [facility] [address] [place], [place] [address] [phone] 07/09/2021 8:44 AM

## 2021-08-08 ENCOUNTER — Telehealth (HOSPITAL_COMMUNITY): Payer: Self-pay | Admitting: *Deleted

## 2021-08-08 MED ORDER — APIXABAN 5 MG PO TABS: 5.0000 mg | ORAL_TABLET | Freq: Two times a day (BID) | ORAL | 2 refills | Status: DC

## 2021-08-08 NOTE — Telephone Encounter (Signed)
Pt states he converted back into normal rhythm about 30 minutes ago. Confirmed by apple watch and bp machine. Feels back to baseline. Per Jorja Loa PA - can hold off on eliquis since in AF less than 12 hours. Appt canceled for next week. He will call if issues arise.

## 2021-08-08 NOTE — Telephone Encounter (Signed)
Pt called in stating he went into AF around 130am this morning according to his apple watch. He took a cardizem 30mg  tab which helped his HR but he is still in AF. He has no energy and with exertion is HR is 110-120 but will return to 60s with rest. BP 115/65 HR 65 currently. Per PA will resume Eliquis 5mg  BID and follow up Monday if still out of rhythm. He can use PRN cardizem for elevated rates. Pt in agreement.

## 2021-08-11 ENCOUNTER — Ambulatory Visit (HOSPITAL_COMMUNITY): Payer: PRIVATE HEALTH INSURANCE | Admitting: Physician Assistant

## 2022-07-08 ENCOUNTER — Ambulatory Visit (HOSPITAL_COMMUNITY)
Admission: RE | Admit: 2022-07-08 | Discharge: 2022-07-08 | Disposition: A | Discharge status: Home / Self Care | Payer: PRIVATE HEALTH INSURANCE | Source: Ambulatory Visit | Attending: Physician Assistant | Admitting: Physician Assistant

## 2022-07-08 ENCOUNTER — Encounter (HOSPITAL_COMMUNITY): Payer: Self-pay | Admitting: Physician Assistant

## 2022-07-08 VITALS — BP 110/68 | HR 73 | Ht 70.0 in | Wt 163.8 lb

## 2022-07-08 DIAGNOSIS — Z8249 Family history of ischemic heart disease and other diseases of the circulatory system: Secondary | ICD-10-CM | POA: Diagnosis not present

## 2022-07-08 DIAGNOSIS — Z9989 Dependence on other enabling machines and devices: Secondary | ICD-10-CM | POA: Insufficient documentation

## 2022-07-08 DIAGNOSIS — E785 Hyperlipidemia, unspecified: Secondary | ICD-10-CM | POA: Diagnosis not present

## 2022-07-08 DIAGNOSIS — E119 Type 2 diabetes mellitus without complications: Secondary | ICD-10-CM | POA: Insufficient documentation

## 2022-07-08 DIAGNOSIS — Z7984 Long term (current) use of oral hypoglycemic drugs: Secondary | ICD-10-CM | POA: Diagnosis not present

## 2022-07-08 DIAGNOSIS — G4733 Obstructive sleep apnea (adult) (pediatric): Secondary | ICD-10-CM | POA: Insufficient documentation

## 2022-07-08 DIAGNOSIS — I480 Paroxysmal atrial fibrillation: Secondary | ICD-10-CM

## 2022-07-08 NOTE — Progress Notes (Signed)
Primary Care Physician: Darrow Bussing, MD Primary Cardiologist: none Primary Electrophysiologist: none Referring Physician: [facility]   Blake Oconnor is a 66 y.o. male with a history of DM, OSA, HLD, and atrial fibrillation who presents for follow up in the [facility]. The patient was initially diagnosed with atrial fibrillation 02/21/21 after presenting to the ED with symptoms of palpitations. His Apple Watch read afib with RVR. He started having associated symptoms of dizziness and weakness and so EMS was called. ECG at the ED showed rate controlled afib. He underwent DCCV at that time and was started on Eliquis a CHADS2VASC score of 1. There were no specific triggers that he could identify. He is compliant with his CPAP and drinks 2-3 glasses of wine per week.   On follow up today, patient reports that he has very infrequent palpitations lasting only a few seconds. He has not had to take his PRN diltiazem.   Today, he denies symptoms of chest pain, shortness of breath, orthopnea, PND, lower extremity edema, dizziness, presyncope, syncope, bleeding, or neurologic sequela. The patient is tolerating medications without difficulties and is otherwise without complaint today.    Atrial Fibrillation Risk Factors:  he does have symptoms or diagnosis of sleep apnea. he is compliant with CPAP therapy. he does not have a history of rheumatic fever. he does have a history of alcohol use. The patient does not have a history of early familial atrial fibrillation or other arrhythmias.  he has a BMI of Body mass index is 23.5 kg/m.Marland Kitchen Filed Weights   07/08/22 1441  Weight: 74.3 kg    Family History  Problem Relation Age of Onset   Diabetes Father    Diabetes Brother    Diabetes Brother    Breast cancer Mother    Cervical cancer Mother    Stroke Paternal Uncle    Heart attack Neg Hx    Lung cancer Neg Hx      Atrial Fibrillation Management  history:  Previous antiarrhythmic drugs: none Previous cardioversions: 02/21/21 Previous ablations: none CHADS2VASC score: 1 Anticoagulation history: Eliquis   Past Medical History:  Diagnosis Date   BPH (benign prostatic hyperplasia)    Diabetes mellitus without complication (HCC)    HLD (hyperlipidemia)    OSA (obstructive sleep apnea)    Past Surgical History:  Procedure Laterality Date   HERNIA REPAIR     hydrocelectomy     vericocelectomy      Current Outpatient Medications  Medication Sig Dispense Refill   acetaminophen (TYLENOL) 500 MG tablet Take 500 mg by mouth every 6 (six) hours as needed.     cetirizine (ZYRTEC) 10 MG tablet      diltiazem (CARDIZEM) 30 MG tablet Take 1 tablet every 4 hours AS NEEDED for heart rate >100 as long as blood pressure >100. 30 tablet 1   finasteride (PROSCAR) 5 MG tablet Take 5 mg by mouth daily.     glipiZIDE (GLUCOTROL XL) 2.5 MG 24 hr tablet Take 2.5 mg by mouth daily.     glucose blood (ONETOUCH VERIO) test strip See admin instructions.     JARDIANCE 25 MG TABS tablet Take 25 mg by mouth daily.     metFORMIN (GLUCOPHAGE) 1000 MG tablet Take 1 tablet by mouth 2 (two) times daily.     OneTouch Delica Lancets 33G MISC See admin instructions.     simvastatin (ZOCOR) 20 MG tablet Take 20 mg by mouth daily.  tamsulosin (FLOMAX) 0.4 MG CAPS capsule Take 0.4 mg by mouth daily.     No current facility-administered medications for this encounter.    No Known Allergies  Social History   Socioeconomic History   Marital status: Married    Spouse name: Not on file   Number of children: Not on file   Years of education: Not on file   Highest education level: Not on file  Occupational History   Not on file  Tobacco Use   Smoking status: Former    Types: Cigars   Smokeless tobacco: Never   Tobacco comments:    Former smoker  07/08/22  Substance and Sexual Activity   Alcohol use: Yes    Alcohol/week: 3.0 standard drinks of  alcohol    Types: 3 Glasses of wine per week    Comment: 1 glass of wine at times 07/08/22   Drug use: Never   Sexual activity: Not on file  Other Topics Concern   Not on file  Social History Narrative   Not on file   Social Determinants of Health   Financial Resource Strain: Not on file  Food Insecurity: Not on file  Transportation Needs: Not on file  Physical Activity: Not on file  Stress: Not on file  Social Connections: Not on file  Intimate Partner Violence: Not on file     ROS- All systems are reviewed and negative except as per the HPI above.  Physical Exam: Vitals:   07/08/22 1441  BP: 110/68  Pulse: 73  Weight: 74.3 kg  Height: 5\' 10"  (1.778 m)     GEN- The patient is a well appearing male, alert and oriented x 3 today.   HEENT-head normocephalic, atraumatic, sclera clear, conjunctiva pink, hearing intact, trachea midline. Lungs- Clear to ausculation bilaterally, normal work of breathing Heart- Regular rate and rhythm, no murmurs, rubs or gallops  GI- soft, NT, ND, + BS Extremities- no clubbing, cyanosis, or edema MS- no significant deformity or atrophy Skin- no rash or lesion Psych- euthymic mood, full affect Neuro- strength and sensation are intact   Wt Readings from Last 3 Encounters:  07/08/22 74.3 kg  07/09/21 81 kg  04/07/21 81 kg    EKG today demonstrates  SR Vent. rate 73 BPM PR interval 138 ms QRS duration 90 ms QT/QTcB 364/401 ms  Echo 04/07/21  1. Left ventricular ejection fraction, by estimation, is 50 to 55%. The  left ventricle has low normal function. The left ventricle has no regional  wall motion abnormalities. Left ventricular diastolic parameters were  normal.   2. Right ventricular systolic function is normal. The right ventricular  size is normal. There is normal pulmonary artery systolic pressure.   3. The mitral valve is normal in structure. Trivial mitral valve  regurgitation. No evidence of mitral stenosis.   4. The  aortic valve is tricuspid. Aortic valve regurgitation is not  visualized. No aortic stenosis is present.   5. Aortic dilatation noted. There is mild dilatation of the aortic root,  measuring 38 mm. There is mild dilatation of the ascending aorta,  measuring 38 mm.   6. The inferior vena cava is normal in size with greater than 50%  respiratory variability, suggesting right atrial pressure of 3 mmHg.   Epic records are reviewed at length today  CHA2DS2-VASc Score = 1  The patient's score is based upon: CHF History: 0 HTN History: 0 Diabetes History: 1 Stroke History: 0 Vascular Disease History: 0 Age Score: 0 Gender  Score: 0        ASSESSMENT AND PLAN: 1. Paroxysmal Atrial Fibrillation (ICD10:  I48.0) The patient's CHA2DS2-VASc score is 1, indicating a 0.6% annual risk of stroke.   Patient appears to be maintaining SR.  No anticoagulation indicated at this time with low CV score. Continue diltiazem 30 mg PRN q 4 hours for heart racing.  Apple Watch for home monitoring  If he should have recurrence of his afib, he is agreeable to front line ablation.   2. Obstructive sleep apnea Encouraged compliance with CPAP therapy.  3. Family history of CAD/MI Patient has several family members pass away from MI and stroke. Given his strong family history and personal history or DM and HLD, will check a CAC score.    Follow up in the AF clinic in one year.    Jorja Loa PA-C Afib Clinic [facility] [address] [place], [place] [address] [phone] 07/08/2022 3:05 PM

## 2022-08-13 IMAGING — DX DG CHEST 1V PORT
1 series · 1 of 1 positions shown · non-contrast
Comparison: December 18, 2004.

CLINICAL DATA: Atrial fibrillation.

EXAM:
PORTABLE CHEST 1 VIEW

[chest ap]
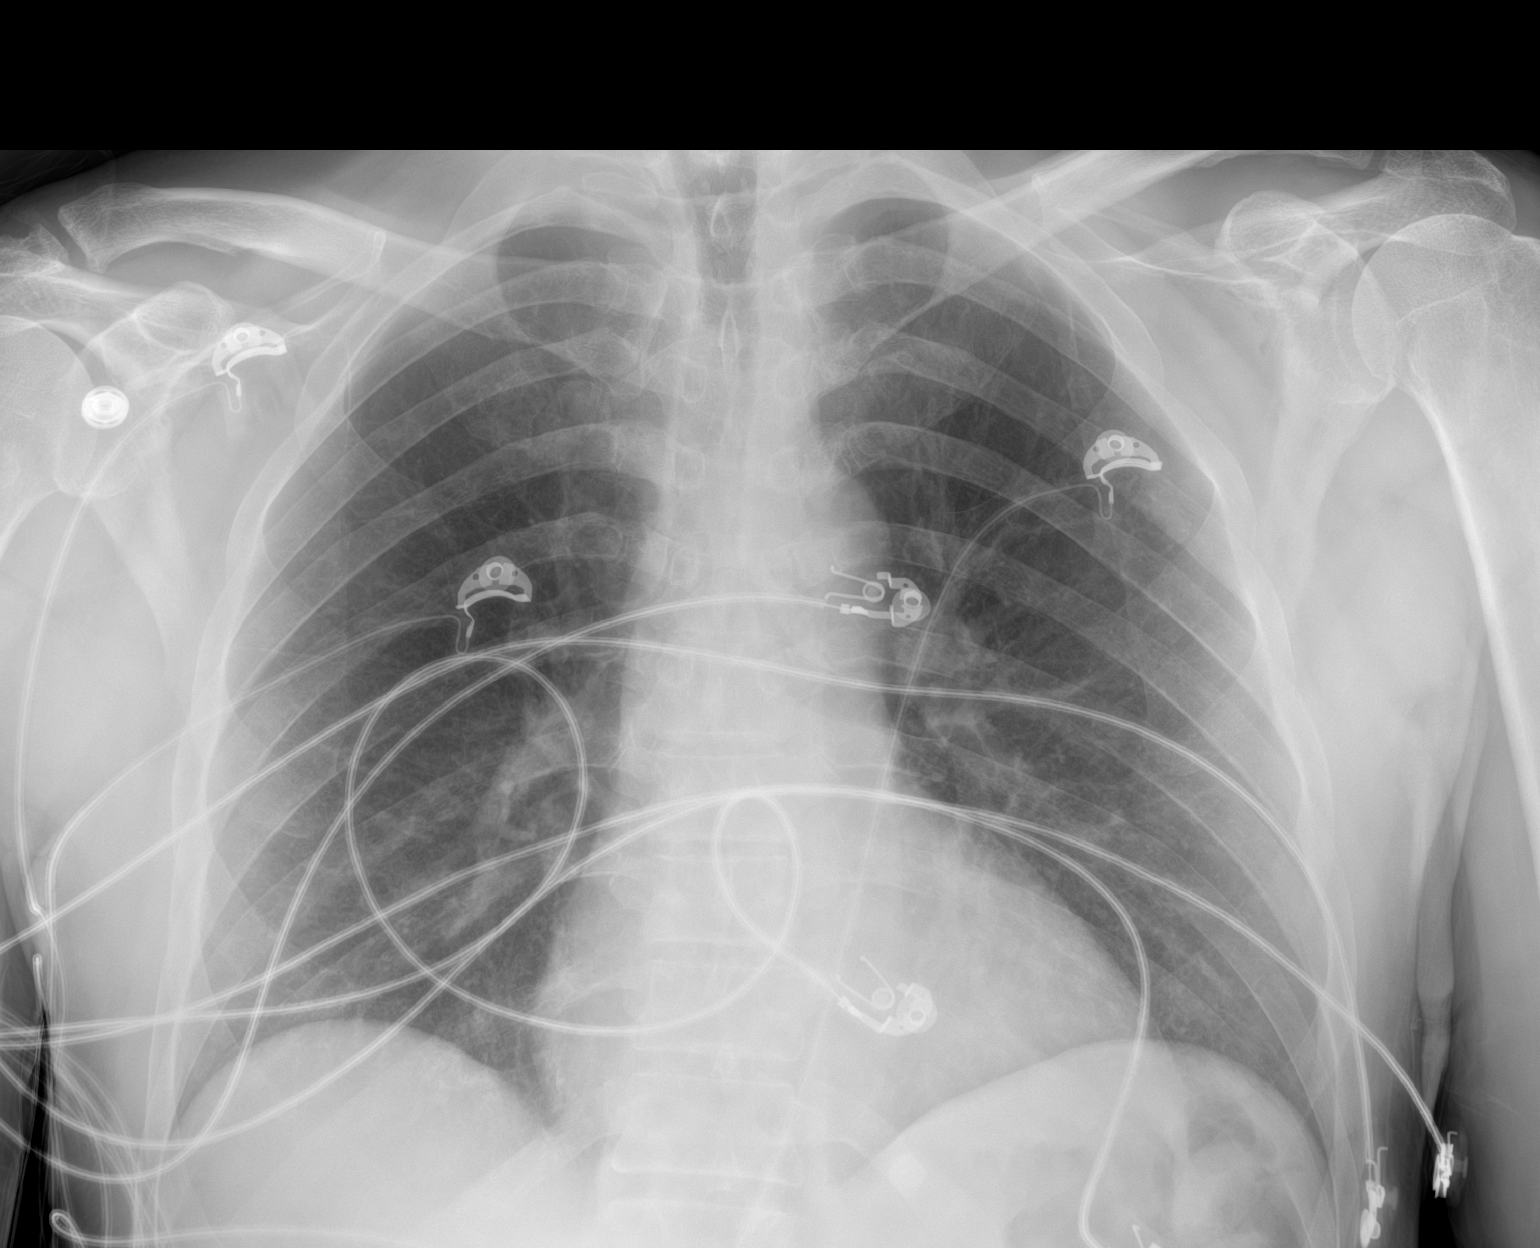

[1 of 1 positions shown; findings below may reference images not displayed]

FINDINGS: The heart size and mediastinal contours are within normal limits.
Both lungs are clear. No pneumothorax or pleural effusion is noted.
The visualized skeletal structures are unremarkable.
IMPRESSION: No active disease.

## 2022-08-17 ENCOUNTER — Ambulatory Visit (HOSPITAL_BASED_OUTPATIENT_CLINIC_OR_DEPARTMENT_OTHER)
Admission: RE | Admit: 2022-08-17 | Discharge: 2022-08-17 | Disposition: A | Discharge status: Home / Self Care | Payer: PRIVATE HEALTH INSURANCE | Source: Ambulatory Visit | Attending: Physician Assistant | Admitting: Physician Assistant

## 2022-08-17 DIAGNOSIS — I480 Paroxysmal atrial fibrillation: Secondary | ICD-10-CM | POA: Insufficient documentation

## 2022-08-19 ENCOUNTER — Encounter (HOSPITAL_COMMUNITY): Payer: Self-pay | Admitting: *Deleted

## 2022-11-06 ENCOUNTER — Telehealth (HOSPITAL_COMMUNITY): Payer: Self-pay | Admitting: *Deleted

## 2022-11-06 NOTE — Telephone Encounter (Signed)
[place] fishing and went into afib did not feel well and EMS was called. EKG performed confirming afib HR 90-110 BP 111/68. Pt felt better after being assessed by EMS therefore he declined ER visit. Pt back at beach house feeling some better but wanted instruction on cardizem prn usage. Direction reviewed with patient he will take a cardizem now and repeat every 4-6 hours as needed for elevated rates. ER precautions reviewed with patient. He will call once back in [place] if he continues in afib on Monday.

## 2023-07-12 ENCOUNTER — Encounter (HOSPITAL_COMMUNITY): Payer: Self-pay | Admitting: Physician Assistant

## 2023-07-12 ENCOUNTER — Ambulatory Visit (HOSPITAL_COMMUNITY)
Admission: RE | Admit: 2023-07-12 | Discharge: 2023-07-12 | Disposition: A | Discharge status: Home / Self Care | Payer: PRIVATE HEALTH INSURANCE | Source: Ambulatory Visit | Attending: Physician Assistant | Admitting: Physician Assistant

## 2023-07-12 VITALS — BP 118/82 | HR 68 | Ht 70.0 in | Wt 167.8 lb

## 2023-07-12 DIAGNOSIS — I480 Paroxysmal atrial fibrillation: Secondary | ICD-10-CM | POA: Diagnosis present

## 2023-07-12 DIAGNOSIS — G4733 Obstructive sleep apnea (adult) (pediatric): Secondary | ICD-10-CM | POA: Insufficient documentation

## 2023-07-12 NOTE — Progress Notes (Signed)
Primary Care Physician: Darrow Bussing, MD Primary Cardiologist: none Primary Electrophysiologist: none Referring Physician: [facility]   Blake Oconnor is a 67 y.o. male with a history of DM, OSA, HLD, and atrial fibrillation who presents for follow up in the [facility]. The patient was initially diagnosed with atrial fibrillation 02/21/21 after presenting to the ED with symptoms of palpitations. His Apple Watch read afib with RVR. He started having associated symptoms of dizziness and weakness and so EMS was called. ECG at the ED showed rate controlled afib. He underwent DCCV at that time and was started on Eliquis a CHADS2VASC score of 1. There were no specific triggers that he could identify. He is compliant with his CPAP and drinks 2-3 glasses of wine per week.   On follow up today, patient reports that he has done well since his last visit. He did have some afib during a fishing trip in December last year and one other 10 second episode about two weeks ago. Otherwise, he has been staying in [place].   Today, he denies symptoms of palpitations, chest pain, shortness of breath, orthopnea, PND, lower extremity edema, dizziness, presyncope, syncope, bleeding, or neurologic sequela. The patient is tolerating medications without difficulties and is otherwise without complaint today.    Atrial Fibrillation Risk Factors:  he does have symptoms or diagnosis of sleep apnea. he is compliant with CPAP therapy. he does not have a history of rheumatic fever. he does have a history of alcohol use. The patient does not have a history of early familial atrial fibrillation or other arrhythmias.   Atrial Fibrillation Management history:  Previous antiarrhythmic drugs: none Previous cardioversions: 02/21/21 Previous ablations: none Anticoagulation history: Eliquis   Past Medical History:  Diagnosis Date   BPH (benign prostatic hyperplasia)    Diabetes mellitus  without complication (HCC)    HLD (hyperlipidemia)    OSA (obstructive sleep apnea)     Current Outpatient Medications  Medication Sig Dispense Refill   acetaminophen (TYLENOL) 500 MG tablet Take 500 mg by mouth every 6 (six) hours as needed.     cetirizine (ZYRTEC) 10 MG tablet Take 10 mg by mouth as needed.     diltiazem (CARDIZEM) 30 MG tablet Take 1 tablet every 4 hours AS NEEDED for heart rate >100 as long as blood pressure >100. 30 tablet 1   glipiZIDE (GLUCOTROL XL) 2.5 MG 24 hr tablet Take 2.5 mg by mouth daily.     glucose blood (ONETOUCH VERIO) test strip See admin instructions.     JARDIANCE 25 MG TABS tablet Take 25 mg by mouth daily.     metFORMIN (GLUCOPHAGE) 1000 MG tablet Take 1 tablet by mouth 2 (two) times daily.     OneTouch Delica Lancets 33G MISC See admin instructions.     simvastatin (ZOCOR) 20 MG tablet Take 20 mg by mouth daily.     finasteride (PROSCAR) 5 MG tablet Take 5 mg by mouth daily.     tamsulosin (FLOMAX) 0.4 MG CAPS capsule Take 0.4 mg by mouth daily.     No current facility-administered medications for this encounter.    ROS- All systems are reviewed and negative except as per the HPI above.  Physical Exam: Vitals:   07/12/23 0822  BP: 118/82  Pulse: 68  Weight: 76.1 kg  Height: 5\' 10"  (1.778 m)     GEN: Well nourished, well developed in no acute distress NECK: No JVD; No carotid bruits CARDIAC: Regular  rate and rhythm, no murmurs, rubs, gallops RESPIRATORY:  Clear to auscultation without rales, wheezing or rhonchi  ABDOMEN: Soft, non-tender, non-distended EXTREMITIES:  No edema; No deformity    Wt Readings from Last 3 Encounters:  07/12/23 76.1 kg  07/08/22 74.3 kg  07/09/21 81 kg    EKG today demonstrates  SR Vent. rate 68 BPM PR interval 136 ms QRS duration 88 ms QT/QTcB 378/401 ms  Echo 04/07/21  1. Left ventricular ejection fraction, by estimation, is 50 to 55%. The  left ventricle has low normal function. The left  ventricle has no regional  wall motion abnormalities. Left ventricular diastolic parameters were  normal.   2. Right ventricular systolic function is normal. The right ventricular  size is normal. There is normal pulmonary artery systolic pressure.   3. The mitral valve is normal in structure. Trivial mitral valve  regurgitation. No evidence of mitral stenosis.   4. The aortic valve is tricuspid. Aortic valve regurgitation is not  visualized. No aortic stenosis is present.   5. Aortic dilatation noted. There is mild dilatation of the aortic root,  measuring 38 mm. There is mild dilatation of the ascending aorta,  measuring 38 mm.   6. The inferior vena cava is normal in size with greater than 50%  respiratory variability, suggesting right atrial pressure of 3 mmHg.   Epic records are reviewed at length today  CHA2DS2-VASc Score = 1  The patient's score is based upon: CHF History: 0 HTN History: 0 Diabetes History: 1 Stroke History: 0 Vascular Disease History: 0 Age Score: 0 Gender Score: 0        ASSESSMENT AND PLAN: Paroxysmal Atrial Fibrillation (ICD10:  I48.0) The patient's CHA2DS2-VASc score is 1, indicating a 0.6% annual risk of stroke.   Patient appears to be maintaining SR No anticoagulation indicated at this time with low CV score. Continue diltiazem 30 mg PRN q 4 hours for heart racing.  Apple Watch for home monitoring  If he should have recurrence of his afib, he is agreeable to front line ablation.   OSA  Encouraged nightly CPAP    Follow up in the AF clinic in one year.    Jorja Loa PA-C Afib [facility] [address] [place], [place] [address] [phone] 07/12/2023 9:04 AM

## 2023-07-21 ENCOUNTER — Other Ambulatory Visit (HOSPITAL_COMMUNITY): Payer: Self-pay | Admitting: *Deleted

## 2023-07-21 MED ORDER — DILTIAZEM HCL 30 MG PO TABS: ORAL_TABLET | ORAL | 1 refills | Status: AC

## 2024-01-12 DIAGNOSIS — R0902 Hypoxemia: Secondary | ICD-10-CM | POA: Diagnosis not present

## 2024-02-16 DIAGNOSIS — G4733 Obstructive sleep apnea (adult) (pediatric): Secondary | ICD-10-CM | POA: Diagnosis not present

## 2024-02-16 DIAGNOSIS — E1165 Type 2 diabetes mellitus with hyperglycemia: Secondary | ICD-10-CM | POA: Diagnosis not present

## 2024-02-23 DIAGNOSIS — D6869 Other thrombophilia: Secondary | ICD-10-CM | POA: Diagnosis not present

## 2024-02-23 DIAGNOSIS — I4891 Unspecified atrial fibrillation: Secondary | ICD-10-CM | POA: Diagnosis not present

## 2024-02-23 DIAGNOSIS — R32 Unspecified urinary incontinence: Secondary | ICD-10-CM | POA: Diagnosis not present

## 2024-02-23 DIAGNOSIS — Z833 Family history of diabetes mellitus: Secondary | ICD-10-CM | POA: Diagnosis not present

## 2024-02-23 DIAGNOSIS — G4733 Obstructive sleep apnea (adult) (pediatric): Secondary | ICD-10-CM | POA: Diagnosis not present

## 2024-02-23 DIAGNOSIS — Z8249 Family history of ischemic heart disease and other diseases of the circulatory system: Secondary | ICD-10-CM | POA: Diagnosis not present

## 2024-02-23 DIAGNOSIS — E785 Hyperlipidemia, unspecified: Secondary | ICD-10-CM | POA: Diagnosis not present

## 2024-02-23 DIAGNOSIS — N400 Enlarged prostate without lower urinary tract symptoms: Secondary | ICD-10-CM | POA: Diagnosis not present

## 2024-02-23 DIAGNOSIS — E119 Type 2 diabetes mellitus without complications: Secondary | ICD-10-CM | POA: Diagnosis not present

## 2024-03-08 DIAGNOSIS — M62838 Other muscle spasm: Secondary | ICD-10-CM | POA: Diagnosis not present

## 2024-03-08 DIAGNOSIS — I4891 Unspecified atrial fibrillation: Secondary | ICD-10-CM | POA: Diagnosis not present

## 2024-03-08 DIAGNOSIS — E1165 Type 2 diabetes mellitus with hyperglycemia: Secondary | ICD-10-CM | POA: Diagnosis not present

## 2024-03-08 DIAGNOSIS — I10 Essential (primary) hypertension: Secondary | ICD-10-CM | POA: Diagnosis not present

## 2024-03-08 DIAGNOSIS — E7800 Pure hypercholesterolemia, unspecified: Secondary | ICD-10-CM | POA: Diagnosis not present

## 2024-03-24 DIAGNOSIS — E7800 Pure hypercholesterolemia, unspecified: Secondary | ICD-10-CM | POA: Diagnosis not present

## 2024-03-24 DIAGNOSIS — I10 Essential (primary) hypertension: Secondary | ICD-10-CM | POA: Diagnosis not present

## 2024-03-24 DIAGNOSIS — E119 Type 2 diabetes mellitus without complications: Secondary | ICD-10-CM | POA: Diagnosis not present

## 2024-03-24 DIAGNOSIS — I4891 Unspecified atrial fibrillation: Secondary | ICD-10-CM | POA: Diagnosis not present

## 2024-04-03 DIAGNOSIS — R351 Nocturia: Secondary | ICD-10-CM | POA: Diagnosis not present

## 2024-04-03 DIAGNOSIS — R3914 Feeling of incomplete bladder emptying: Secondary | ICD-10-CM | POA: Diagnosis not present

## 2024-04-03 DIAGNOSIS — N401 Enlarged prostate with lower urinary tract symptoms: Secondary | ICD-10-CM | POA: Diagnosis not present

## 2024-04-10 DIAGNOSIS — Z125 Encounter for screening for malignant neoplasm of prostate: Secondary | ICD-10-CM | POA: Diagnosis not present

## 2024-06-08 DIAGNOSIS — E119 Type 2 diabetes mellitus without complications: Secondary | ICD-10-CM | POA: Diagnosis not present

## 2024-06-08 DIAGNOSIS — I4891 Unspecified atrial fibrillation: Secondary | ICD-10-CM | POA: Diagnosis not present

## 2024-06-08 DIAGNOSIS — I10 Essential (primary) hypertension: Secondary | ICD-10-CM | POA: Diagnosis not present

## 2024-06-08 DIAGNOSIS — E7800 Pure hypercholesterolemia, unspecified: Secondary | ICD-10-CM | POA: Diagnosis not present

## 2024-07-11 ENCOUNTER — Ambulatory Visit (HOSPITAL_COMMUNITY): Payer: PRIVATE HEALTH INSURANCE | Admitting: Physician Assistant

## 2024-07-11 DIAGNOSIS — G4733 Obstructive sleep apnea (adult) (pediatric): Secondary | ICD-10-CM | POA: Diagnosis not present

## 2024-08-08 ENCOUNTER — Ambulatory Visit (HOSPITAL_COMMUNITY): Payer: PRIVATE HEALTH INSURANCE | Admitting: Physician Assistant

## 2024-08-11 DIAGNOSIS — G4733 Obstructive sleep apnea (adult) (pediatric): Secondary | ICD-10-CM | POA: Diagnosis not present

## 2024-08-15 ENCOUNTER — Ambulatory Visit (HOSPITAL_COMMUNITY): Payer: PRIVATE HEALTH INSURANCE | Admitting: Physician Assistant

## 2024-08-15 DIAGNOSIS — M25561 Pain in right knee: Secondary | ICD-10-CM | POA: Diagnosis not present

## 2024-08-15 DIAGNOSIS — Z23 Encounter for immunization: Secondary | ICD-10-CM | POA: Diagnosis not present

## 2024-08-29 DIAGNOSIS — G4733 Obstructive sleep apnea (adult) (pediatric): Secondary | ICD-10-CM | POA: Diagnosis not present

## 2024-09-04 ENCOUNTER — Ambulatory Visit (HOSPITAL_COMMUNITY)
Admission: RE | Admit: 2024-09-04 | Discharge: 2024-09-04 | Disposition: A | Discharge status: Home / Self Care | Payer: PRIVATE HEALTH INSURANCE | Source: Ambulatory Visit | Attending: Physician Assistant | Admitting: Physician Assistant

## 2024-09-04 VITALS — BP 110/58 | HR 76 | Ht 70.0 in | Wt 160.0 lb

## 2024-09-04 DIAGNOSIS — I4891 Unspecified atrial fibrillation: Secondary | ICD-10-CM | POA: Diagnosis not present

## 2024-09-04 DIAGNOSIS — D6869 Other thrombophilia: Secondary | ICD-10-CM | POA: Diagnosis not present

## 2024-09-04 DIAGNOSIS — I480 Paroxysmal atrial fibrillation: Secondary | ICD-10-CM | POA: Diagnosis not present

## 2024-09-04 NOTE — Progress Notes (Signed)
 Primary Care Physician: Regino Slater, MD Primary Cardiologist: none Primary Electrophysiologist: none Referring Physician: [facility]   Blake Oconnor is a 68 y.o. male with a history of DM, OSA, HLD, and atrial fibrillation who presents for follow up in the [facility]. The patient was initially diagnosed with atrial fibrillation 02/21/21 after presenting to the ED with symptoms of palpitations. His Apple Watch read afib with RVR. He started having associated symptoms of dizziness and weakness and so EMS was called. ECG at the ED showed rate controlled afib. He underwent DCCV at that time and was temporarily on Eliquis  afterward.   Patient returns for follow up for atrial fibrillation. He reports that he has done well since his last visit with no interim symptoms of afib. He is in the process of moving to [employer].   Today, he  denies symptoms of palpitations, chest pain, shortness of breath, orthopnea, PND, lower extremity edema, dizziness, presyncope, syncope, bleeding, or neurologic sequela. The patient is tolerating medications without difficulties and is otherwise without complaint today.    Atrial Fibrillation Risk Factors:  he does have symptoms or diagnosis of sleep apnea. he does not have a history of rheumatic fever. he does have a history of alcohol use. 2-3 glasses of wine per week.  The patient does not have a history of early familial atrial fibrillation or other arrhythmias.   Atrial Fibrillation Management history:  Previous antiarrhythmic drugs: none Previous cardioversions: 02/21/21 Previous ablations: none Anticoagulation history: Eliquis    Past Medical History:  Diagnosis Date   BPH (benign prostatic hyperplasia)    Diabetes mellitus without complication (HCC)    HLD (hyperlipidemia)    OSA (obstructive sleep apnea)     Current Outpatient Medications  Medication Sig Dispense Refill   acetaminophen (TYLENOL) 500 MG  tablet Take 500 mg by mouth every 6 (six) hours as needed.     cetirizine (ZYRTEC) 10 MG tablet Take 10 mg by mouth as needed.     diltiazem  (CARDIZEM ) 30 MG tablet Take 1 tablet every 4 hours AS NEEDED for heart rate >100 as long as blood pressure >100. 30 tablet 1   finasteride (PROSCAR) 5 MG tablet Take 5 mg by mouth daily.     glipiZIDE (GLUCOTROL XL) 2.5 MG 24 hr tablet Take 2.5 mg by mouth daily.     glucose blood (ONETOUCH VERIO) test strip See admin instructions.     JARDIANCE 25 MG TABS tablet Take 25 mg by mouth daily.     metFORMIN (GLUCOPHAGE) 1000 MG tablet Take 1 tablet by mouth 2 (two) times daily.     OneTouch Delica Lancets 33G MISC See admin instructions.     simvastatin (ZOCOR) 20 MG tablet Take 20 mg by mouth daily.     tamsulosin (FLOMAX) 0.4 MG CAPS capsule Take 0.4 mg by mouth daily.     No current facility-administered medications for this encounter.    ROS- All systems are reviewed and negative except as per the HPI above.  Physical Exam: Vitals:   09/04/24 1318  Weight: 72.6 kg  Height: 5' 10 (1.778 m)    GEN: Well nourished, well developed in no acute distress CARDIAC: Regular rate and rhythm, no murmurs, rubs, gallops RESPIRATORY:  Clear to auscultation without rales, wheezing or rhonchi  ABDOMEN: Soft, non-tender, non-distended EXTREMITIES:  No edema; No deformity    Wt Readings from Last 3 Encounters:  09/04/24 72.6 kg  07/12/23 76.1 kg  07/08/22 74.3 kg  EKG today demonstrates  SR Vent. rate 76 BPM PR interval 134 ms QRS duration 90 ms QT/QTcB 378/425 ms   Echo 04/07/21  1. Left ventricular ejection fraction, by estimation, is 50 to 55%. The  left ventricle has low normal function. The left ventricle has no regional  wall motion abnormalities. Left ventricular diastolic parameters were  normal.   2. Right ventricular systolic function is normal. The right ventricular  size is normal. There is normal pulmonary artery systolic pressure.    3. The mitral valve is normal in structure. Trivial mitral valve  regurgitation. No evidence of mitral stenosis.   4. The aortic valve is tricuspid. Aortic valve regurgitation is not  visualized. No aortic stenosis is present.   5. Aortic dilatation noted. There is mild dilatation of the aortic root,  measuring 38 mm. There is mild dilatation of the ascending aorta,  measuring 38 mm.   6. The inferior vena cava is normal in size with greater than 50%  respiratory variability, suggesting right atrial pressure of 3 mmHg.   Epic records are reviewed at length today   CHA2DS2-VASc Score = 2  The patient's score is based upon: CHF History: 0 HTN History: 0 Diabetes History: 1 Stroke History: 0 Vascular Disease History: 0 Age Score: 1 Gender Score: 0       ASSESSMENT AND PLAN: Paroxysmal Atrial Fibrillation (ICD10:  I48.0) The patient's CHA2DS2-VASc score is 2, indicating a 2.2% annual risk of stroke.   Patient appears to be maintaining SR Apple Watch for home monitoring.  Continue diltiazem  30 mg PRN q 4 hours for heart racing.   Secondary Hypercoagulable State (ICD10:  D68.69) The patient is at significant risk for stroke/thromboembolism based upon his CHA2DS2-VASc Score of 2.  Now that he has turned 67 his CV score is 2. We discussed that if he has afib detected on his smart watch would recommend starting anticoagulation.    OSA  Encouraged nightly CPAP   Follow up in the AF clinic as needed. He plans to establish care with PCP/cardiology in [place].    Daril Kicks PA-C Afib Clinic [facility] [address] [place], [place] [address] [phone] 09/04/2024 1:22 PM

## 2024-09-05 DIAGNOSIS — I4891 Unspecified atrial fibrillation: Secondary | ICD-10-CM | POA: Diagnosis not present

## 2024-09-05 DIAGNOSIS — I10 Essential (primary) hypertension: Secondary | ICD-10-CM | POA: Diagnosis not present

## 2024-09-05 DIAGNOSIS — E559 Vitamin D deficiency, unspecified: Secondary | ICD-10-CM | POA: Diagnosis not present

## 2024-09-05 DIAGNOSIS — E119 Type 2 diabetes mellitus without complications: Secondary | ICD-10-CM | POA: Diagnosis not present

## 2024-09-05 DIAGNOSIS — E7800 Pure hypercholesterolemia, unspecified: Secondary | ICD-10-CM | POA: Diagnosis not present

## 2024-09-10 DIAGNOSIS — G4733 Obstructive sleep apnea (adult) (pediatric): Secondary | ICD-10-CM | POA: Diagnosis not present

## 2024-10-04 DIAGNOSIS — E119 Type 2 diabetes mellitus without complications: Secondary | ICD-10-CM | POA: Diagnosis not present

## 2024-10-19 DIAGNOSIS — Z125 Encounter for screening for malignant neoplasm of prostate: Secondary | ICD-10-CM | POA: Diagnosis not present

## 2024-10-19 DIAGNOSIS — E7800 Pure hypercholesterolemia, unspecified: Secondary | ICD-10-CM | POA: Diagnosis not present

## 2024-10-19 DIAGNOSIS — E119 Type 2 diabetes mellitus without complications: Secondary | ICD-10-CM | POA: Diagnosis not present

## 2024-10-19 DIAGNOSIS — I4891 Unspecified atrial fibrillation: Secondary | ICD-10-CM | POA: Diagnosis not present
